# Patient Record
Sex: Male | Born: 1980
Health system: Southern US, Community
[De-identification: ages and names within clinical notes are randomized; demographics above are authoritative.]

## PROBLEM LIST (undated history)

## (undated) HISTORY — PX: OTHER SURGICAL HISTORY: SHX169

## (undated) HISTORY — PX: ARTHROSCOPIC REPAIR ACL: SUR80

## (undated) HISTORY — PX: ACHILLES TENDON REPAIR: SUR1153

---

## 2008-03-10 ENCOUNTER — Emergency Department (HOSPITAL_COMMUNITY): Admission: EM | Admit: 2008-03-10 | Discharge: 2008-03-10 | Payer: Self-pay | Admitting: Emergency Medicine

## 2008-12-01 ENCOUNTER — Emergency Department (HOSPITAL_COMMUNITY): Admission: EM | Admit: 2008-12-01 | Discharge: 2008-12-01 | Payer: Self-pay | Admitting: Emergency Medicine

## 2011-05-06 ENCOUNTER — Ambulatory Visit (INDEPENDENT_AMBULATORY_CARE_PROVIDER_SITE_OTHER): Payer: BC Managed Care – PPO | Admitting: Internal Medicine

## 2011-05-06 ENCOUNTER — Encounter: Payer: Self-pay | Admitting: Internal Medicine

## 2011-05-06 VITALS — BP 102/70 | HR 65 | Temp 97.7°F | Ht 73.0 in | Wt 171.8 lb

## 2011-05-06 DIAGNOSIS — R0602 Shortness of breath: Secondary | ICD-10-CM

## 2011-05-06 DIAGNOSIS — J45901 Unspecified asthma with (acute) exacerbation: Secondary | ICD-10-CM

## 2011-05-06 DIAGNOSIS — J45909 Unspecified asthma, uncomplicated: Secondary | ICD-10-CM

## 2011-05-06 MED ORDER — PREDNISONE 10 MG PO TABS: ORAL_TABLET | ORAL | Status: DC

## 2011-05-06 NOTE — Patient Instructions (Addendum)
Your asthma is active Take prednisone 40mg  po daily x 2 days, then 30mg  po daily x 2 days, then 20mg  po daily x 2 days, then 10mg  po daily x 2 days and STOP Take Symbicort 160/4.5 dose 2 puff bid - take samples, discount card, learn technique. This is a CONTROLLER MED. Take it daily without fail Take inhaled nasal steroid 2 squirts each nostril daily Take albuterol only as needed; if you are using it more than  2 times daily call us or come sooner Return in 4-6 weeks with spirometry at followup At fu check alpha 1 and depending on response,  consider allergy eval

## 2011-05-06 NOTE — Progress Notes (Signed)
Subjective:    Patient ID: Jon Munoz, male    DOB: Apr 25, 1981, 30 y.o.   MRN: [id number]  HPI  30 year old non-smoker, non-passive smoker, no fammily hx of asthma. States he had childhood asthma (Dr Foy [place]) but grew out of it.Used advair in high school. But feels past 3 years asthma has come back but would bother him only once each year esp in pollen season and would use rescue albuterol. However, past 3 weeks feels asthma is acting up. C/o increased dyspnea, dry cough, feeling of clearing throat and wheezing that he rates as moderate and severe. Waking up each night with symptoms at 3am and using albuterol inhaler past 3 weeks. This helps short term only for 4-5 hours. Using it alteast 2 times per night currently. Daytime symptoms are milder esp when outside the house. Symptoms are worse inside the house and in the evening. Daytime is not using albuterol. Does not report fever, chest tightness, sputum. Denies GERD. 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  Moved into current house 3 weeks ago with wife and daughter. 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 Before he moved into that house brother lived in it for 2 years but has been vacant since oct 2011. House is not dusty and states it was cleaned up well before moving in. Carpeted bedroom that is 28 years old and is in good shape (Steam cleaned). Hardwood floor. He does not know if there is mold but he states he would not be surprised if there is mold; has not been at crawl space. There is a musty odor +. No roaches in the house but outside there is. Daughter (7 mo) old and wife aare asymptomatic. 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 Not wearing a good mask. This has been the most he has been around the MDF board ever in career. Denies bird, cats, dogs, exposure.  Office spirometry today - fev1 3Ll/63%, ratio 58 - moderate obstruction   Past Medical History  Diagnosis Date  . Asthma   . Allergic rhinitis      Family History  Problem Relation Age of Onset  . Allergies Sister   . Cancer Maternal Grandfather      History   Social History  . Marital Status: Single    Spouse Name: N/A    Number of Children: N/A  . Years of Education: N/A   Occupational History  . general contractor    Social History Main Topics  . Smoking status: Former Smoker -- 0.1 packs/day for 2 years    Types: Cigarettes    Quit date: 04/15/2006  . Smokeless tobacco: Not on file  . Alcohol Use: Yes     3 drinks per week  . Drug Use: No  . Sexually Active: Not on file   Other Topics Concern  . Not on file   Social History Narrative  . No narrative on file     No Known Allergies   No outpatient prescriptions prior  to visit.      Review of Systems Constitutional:   No  weight loss, night sweats,  Fevers, chills, fatigue, lassitude. HEENT:   No headaches,  Difficulty swallowing,  Tooth/dental problems,  Sore throat,                No sneezing, itching, ear ache, nasal congestion, post nasal drip,   CV:  No chest pain,  Orthopnea, PND, swelling in lower extremities, anasarca, dizziness, palpitations  GI  No heartburn, indigestion, abdominal pain, nausea, vomiting, diarrhea, change in bowel habits, loss of appetite  Resp:PER HPI +   Skin: no rash or lesions.  GU: no dysuria, change in color of urine, no urgency or frequency.  No flank pain.  MS:  No joint pain or swelling.  No decreased range of motion.  No back pain.  Psych:  No change in mood or affect. No depression or anxiety.  No memory loss.     Objective:   Physical Exam    Nursing note and vitals reviewed. Constitutional: He is oriented to person, place, and time. He appears well-developed and well-nourished. No distress.  HENT:  Head: Normocephalic and atraumatic.  Right Ear: External ear normal.  Left Ear: External ear normal.  Mouth/Throat: Oropharynx is clear and moist. No oropharyngeal exudate.  Eyes: Conjunctivae and EOM are normal. Pupils are equal, round, and reactive to light. Right eye exhibits no discharge. Left eye exhibits no discharge. No scleral icterus.  Neck: Normal range of motion. Neck supple. No JVD present. No tracheal deviation present. No thyromegaly present.  Cardiovascular: Normal rate, regular rhythm and intact distal pulses.  Exam reveals no gallop and no friction rub.   No murmur heard. Pulmonary/Chest: Effort normal and breath sounds normal. No respiratory distress. He has no wheezes. He has no rales. He exhibits no tenderness.  Abdominal: Soft. Bowel sounds are normal. He exhibits no distension and no mass. There is no tenderness. There is no rebound and no guarding.  Musculoskeletal: Normal range of motion. He exhibits no edema and no tenderness.  Lymphadenopathy:    He has no cervical adenopathy.  Neurological: He is alert and oriented to person, place, and time. He has normal reflexes. No cranial nerve deficit. Coordination normal.  Skin: Skin is warm and dry. No rash noted. He is not diaphoretic. No erythema. No pallor.  Psychiatric: He has a normal mood and affect. His behavior is normal. Judgment and thought content normal.          Assessment & Plan:

## 2011-05-08 ENCOUNTER — Encounter: Payer: Self-pay | Admitting: Internal Medicine

## 2011-05-08 DIAGNOSIS — J45901 Unspecified asthma with (acute) exacerbation: Secondary | ICD-10-CM | POA: Insufficient documentation

## 2011-05-08 MED ORDER — BUDESONIDE-FORMOTEROL FUMARATE 160-4.5 MCG/ACT IN AERO: 2.0000 | INHALATION_SPRAY | Freq: Two times a day (BID) | RESPIRATORY_TRACT | Status: DC

## 2011-05-08 NOTE — Progress Notes (Signed)
Addended by: Kalman Shan on: 05/08/2011 02:17 AM   Modules accepted: Orders, Level of Service, SmartSet

## 2011-05-08 NOTE — Assessment & Plan Note (Signed)
He is clearly in Ae-asthma. I suspect his asthma has been active for a while but recent work exposure and home environment have resulted in acute exacerbation. Though fev1 is in moderate persisten category he is keen to see quick resolution of symptoms and continued ability to work. Therefore, will give him steroid burst  PLAN Your asthma is active Take prednisone 40mg  po daily x 2 days, then 30mg  po daily x 2 days, then 20mg  po daily x 2 days, then 10mg  po daily x 2 days and STOP Take Symbicort 160/4.5 dose 2 puff bid - take samples, discount card, learn technique. This is a CONTROLLER MED. Take it daily without fail Take inhaled nasal steroid 2 squirts each nostril daily Take albuterol only as needed; if you are using it more than  2 times daily call us or come sooner Return in 4-6 weeks with spirometry at followup Counseled about getting mold eval done at home but he prefers not doing it but moving out of house in 6-8 months Depending on response, consider allergy eval and alpha 1 chck

## 2011-07-29 ENCOUNTER — Ambulatory Visit (INDEPENDENT_AMBULATORY_CARE_PROVIDER_SITE_OTHER): Payer: BC Managed Care – PPO | Admitting: Internal Medicine

## 2011-07-29 ENCOUNTER — Encounter: Payer: Self-pay | Admitting: Internal Medicine

## 2011-07-29 VITALS — BP 90/60 | HR 72 | Temp 97.9°F | Ht 73.0 in | Wt 177.2 lb

## 2011-07-29 DIAGNOSIS — J4540 Moderate persistent asthma, uncomplicated: Secondary | ICD-10-CM

## 2011-07-29 DIAGNOSIS — R6889 Other general symptoms and signs: Secondary | ICD-10-CM

## 2011-07-29 DIAGNOSIS — Z23 Encounter for immunization: Secondary | ICD-10-CM

## 2011-07-29 DIAGNOSIS — J45909 Unspecified asthma, uncomplicated: Secondary | ICD-10-CM

## 2011-07-29 DIAGNOSIS — J45901 Unspecified asthma with (acute) exacerbation: Secondary | ICD-10-CM

## 2011-07-29 MED ORDER — FLUTICASONE PROPIONATE 50 MCG/ACT NA SUSP: 2.0000 | Freq: Every day | NASAL | Status: DC

## 2011-07-29 MED ORDER — OMEPRAZOLE-SODIUM BICARBONATE 20-1100 MG PO CAPS: 1.0000 | ORAL_CAPSULE | Freq: Every day | ORAL | Status: DC

## 2011-07-29 MED ORDER — BUDESONIDE-FORMOTEROL FUMARATE 160-4.5 MCG/ACT IN AERO: 2.0000 | INHALATION_SPRAY | Freq: Two times a day (BID) | RESPIRATORY_TRACT | Status: AC

## 2011-07-29 NOTE — Progress Notes (Signed)
Subjective:    Patient ID: Jon Munoz, male    DOB: January 09, 1981, 30 y.o.   MRN: [id number]  HPI IOV 05/06/11: 30 year old non-smoker (active and passive), no family hx of asthma. States he had childhood asthma (Dr Foy [place]) but grew out of it.Used advair in high school. But feels past 3 years asthma has come back but would bother him only once each year esp in pollen season and would use rescue albuterol. However, past 3 weeks feels asthma is acting up. C/o increased dyspnea, dry cough, feeling of clearing throat and wheezing that he rates as moderate and severe. Waking up each night with symptoms at 3am and using albuterol inhaler past 3 weeks. This helps short term only for 4-5 hours. Using it alteast 2 times per night currently. Daytime symptoms are milder esp when outside the house. Symptoms are worse inside the house and in the evening. Daytime is not using albuterol. Does not report fever, chest tightness, sputum. Denies GERD. 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  Moved into current house 3 weeks ago with wife and daughter. 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 Before he moved into that house brother lived in it for 2 years but has been vacant since oct 2011. House is not dusty and states it was cleaned up well before moving in. Carpeted bedroom that is 28 years old and is in good shape (Steam cleaned). Hardwood floor. He does not know if there is mold but he states he would not be surprised if there is mold; has not been at crawl space. There is a musty odor +. No roaches in the house but outside there is. Daughter (7 mo) old and wife are asymptomatic. 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 Not wearing a good mask. This has been the most he has been around the MDF board ever in career. Denies bird, cats, dogs, exposure.  Office spirometry today - fev1 3Ll/63%, ratio 58 - moderate obstruction  REC You have asthma Pred burst Symbicort Nasal steroid Alb prn Return in 4-6 weeks with spirometry at followup At fu check alpha 1 and depending on response,  consider allergy eval   OV 07/28/11: Followup asthma. States symbicort helped a lot. Last week realized he was running out of symbicort so cut down to once a day. Other than that was taking symbicort with 100% compliance. Feels fine in terms of resp issues. Not needing albuterol at all in 3  months. No dyspnea. No wheeze.No cough but feels he is still having to clear throat. This is the only symptom unchanged v mildly better. No nocturnal symptoms. Still in same old house but is having new house built and will move there in 6 months. Fev1 improved 500cc to 3.5L/73% with ratio65 (there is still evidence of obstruction)  His main current concern is the clearing of throat. He says it does not bother him but states wife is bothered by it . Annoying to wife. States he does it atleast  50 times daily. He demonstrated clearing of throat. He feels that something is tickling throat or sometimes upper chest he feels a 'build up' of perhaps 'phlegm'. This happens at night > day but states he does it all the time. Albuterol does not help at all. He feels this symptom is independent of asthma. Dad and brother have the same. This visit admits to sinus drainage. Denies gerd.   His other concern is the long term health and need for ICS/LABA and wondering if asthma and copd same.    Review of Systems  Constitutional: Negative for fever and unexpected weight change.  HENT: Negative for ear pain, nosebleeds, congestion, sore throat, rhinorrhea, sneezing, trouble  swallowing, dental problem, postnasal drip and sinus pressure.   Eyes: Negative for redness and itching.  Respiratory: Negative for cough, chest tightness, shortness of breath and wheezing.   Cardiovascular: Negative for palpitations and leg swelling.  Gastrointestinal: Negative for nausea and vomiting.  Genitourinary: Negative for dysuria.  Musculoskeletal: Negative for joint swelling.  Skin: Negative for rash.  Neurological: Negative for headaches.  Hematological: Does not bruise/bleed easily.  Psychiatric/Behavioral: Negative for dysphoric mood. The patient is not nervous/anxious.        Objective:   Physical Exam Nursing note and vitals reviewed. Constitutional: He is oriented to person, place, and time. He appears well-developed and well-nourished. No distress.  HENT:  Head: Normocephalic and atraumatic.  Right Ear: External ear normal.  Left Ear: External ear normal.  Mouth/Throat: Oropharynx is clear and moist. No oropharyngeal exudate.  Eyes: Conjunctivae and EOM are normal. Pupils are equal, round, and reactive to light. Right eye exhibits no discharge. Left eye exhibits no discharge. No scleral icterus.  Neck: Normal range of motion. Neck supple. No JVD present. No tracheal deviation present. No thyromegaly present.  Cardiovascular: Normal rate, regular rhythm and intact distal pulses.  Exam reveals no gallop and no friction rub.   No murmur heard. Pulmonary/Chest: Effort normal and breath sounds normal. No respiratory distress. He has no wheezes. He has no rales. He exhibits no tenderness.  Abdominal: Soft. Bowel sounds are normal. He exhibits no distension and no mass. There is no tenderness. There is no rebound and no guarding.  Musculoskeletal: Normal range of motion. He exhibits no edema and no tenderness.  Lymphadenopathy:    He has no cervical adenopathy.  Neurological: He is alert and oriented to person, place, and time. He has normal reflexes. No cranial nerve  deficit. Coordination normal.  Skin: Skin is warm and dry. No rash noted. He is not diaphoretic. No erythema. No pallor.  Psychiatric: He has a normal mood and affect. His behavior is normal. Judgment and thought content normal.        Assessment & Plan:

## 2011-07-29 NOTE — Patient Instructions (Addendum)
#  asthma - much better; lung function improved by half liter to 73%. We need you to get to 80% - so please continue symbicort 160/4.5 2 puff twice daily - rinse mouth after use  - take albuterol as needed and before your workouts as well - taking it before workouts is important esp if you find it helps you  - have flu shot - we dicussed copd v asthma - you have asthma. It is important you take inhaler like symbicort for a long time without fail - work on wearing mask at work and cutting down on exposure to triggers #Clearing of throat  - this could be due to sinus, silent acid reflux or both or even habit  - for acid reflux - take otc zegerid 20mg  daily on empty stomach - take diet sheet for acid reflux  - take nasonex samples x 4 - use 2 squirts each nostril daily #Followup - 2 months to see progress  - if no improvement in month or so call us

## 2011-07-30 DIAGNOSIS — R6889 Other general symptoms and signs: Secondary | ICD-10-CM | POA: Insufficient documentation

## 2011-07-30 DIAGNOSIS — J4540 Moderate persistent asthma, uncomplicated: Secondary | ICD-10-CM | POA: Insufficient documentation

## 2011-07-30 NOTE — Assessment & Plan Note (Signed)
#  asthma - much better; lung function. Improved by half liter to 73%. But still room to improve. We need you to get to 80% - so please continue symbicort 160/4.5 2 puff twice daily - rinse mouth after use  - take albuterol as needed and before your workouts as well - taking it before workouts is important esp if you find it helps you  - have flu shot - we dicussed copd v asthma - you have asthma. It is important you take inhaler like symbicort for a long time without fail - work on wearing mask at work and cutting down on exposure to triggers   40 minute total visit time. > 50% of time spent in face to face counseling. 12 min on this section

## 2011-07-30 NOTE — Assessment & Plan Note (Signed)
#  Clearing of throat  - this could be due to sinus, silent acid reflux or both or even habit  - for acid reflux - take otc zegerid 20mg  daily on empty stomach - take diet sheet for acid reflux  - take nasonex samples x 4 - use 2 squirts each nostril daily #Followup - 2 months to see progress  - if no improvement in month or so call us   total visit time. > 50% in face to face counseling. 10 min in this section

## 2012-05-16 ENCOUNTER — Encounter (HOSPITAL_COMMUNITY): Payer: Self-pay | Admitting: *Deleted

## 2012-05-16 ENCOUNTER — Emergency Department (HOSPITAL_COMMUNITY)
Admission: EM | Admit: 2012-05-16 | Discharge: 2012-05-16 | Disposition: A | Discharge status: Home / Self Care | Payer: BC Managed Care – PPO | Attending: Emergency Medicine | Admitting: Emergency Medicine

## 2012-05-16 ENCOUNTER — Emergency Department (HOSPITAL_COMMUNITY): Payer: BC Managed Care – PPO

## 2012-05-16 DIAGNOSIS — J45909 Unspecified asthma, uncomplicated: Secondary | ICD-10-CM | POA: Insufficient documentation

## 2012-05-16 DIAGNOSIS — S81809A Unspecified open wound, unspecified lower leg, initial encounter: Secondary | ICD-10-CM | POA: Insufficient documentation

## 2012-05-16 DIAGNOSIS — S81819A Laceration without foreign body, unspecified lower leg, initial encounter: Secondary | ICD-10-CM

## 2012-05-16 DIAGNOSIS — W1789XA Other fall from one level to another, initial encounter: Secondary | ICD-10-CM | POA: Insufficient documentation

## 2012-05-16 NOTE — ED Provider Notes (Signed)
History     CSN: [id number]  Arrival date & time 05/16/12  1550   First MD Initiated Contact with Patient 05/16/12 [phone]      Chief Complaint  Patient presents with  . Laceration    (Consider location/radiation/quality/duration/timing/severity/associated sxs/prior treatment) Patient is a 31 y.o. male presenting with skin laceration. The history is provided by the patient.  Laceration    patient states that he fell about 6 feet off a walking board. He states a n fell on him and he hit his right lower leg. He has a laceration and pain. No other pains. He states his tetanus is up-to-date. No fevers. No headache. No chest or abdominal pain. He states he's been walking on it with some pain. He  Past Medical History  Diagnosis Date  . Asthma   . Allergic rhinitis     Past Surgical History  Procedure Date  . Arthroscopic repair acl     right knee  . Inguinal hernia repair     right side  . Achilles tendon repair     right foot    Family History  Problem Relation Age of Onset  . Allergies Sister   . Cancer Maternal Grandfather     History  Substance Use Topics  . Smoking status: Former Smoker -- 0.1 packs/day for 2 years    Types: Cigarettes    Quit date: 04/15/2006  . Smokeless tobacco: Not on file  . Alcohol Use: Yes     3 drinks per week      Review of Systems  Constitutional: Negative for chills.  Respiratory: Negative for cough.   Cardiovascular: Negative for chest pain.  Musculoskeletal: Negative for back pain.  Skin: Positive for wound.  Neurological: Negative for tremors, syncope and headaches.    Allergies  Review of patient's allergies indicates no known allergies.  Home Medications   Current Outpatient Rx  Name Route Sig Dispense Refill  . BUDESONIDE-FORMOTEROL FUMARATE 160-4.5 MCG/ACT IN AERO Inhalation Inhale 2 puffs into the lungs 2 (two) times daily. 1 Inhaler 3    BP 120/70  Pulse 63  Temp 97.8 F (36.6 C)  Resp 16  SpO2  100%  Physical Exam  Nursing note and vitals reviewed. Constitutional: He is oriented to person, place, and time. He appears well-developed and well-nourished.  HENT:  Head: Normocephalic and atraumatic.  Eyes: EOM are normal. Pupils are equal, round, and reactive to light.  Neck: Normal range of motion. Neck supple.  Cardiovascular: Normal rate, regular rhythm and normal heart sounds.   No murmur heard. Pulmonary/Chest: Effort normal and breath sounds normal.  Abdominal: Soft. Bowel sounds are normal. He exhibits no distension and no mass. There is no tenderness. There is no rebound and no guarding.  Musculoskeletal: Normal range of motion. He exhibits no edema.       8 centimeter laceration to mid-to lower tibia. There is a swelling proximal to that in the soft tissue. With palpation and spread out. Mild bleeding. Tibia appears stable. Neurovascularly intact distally. He was able to flex and extend his foot at the ankle.   Neurological: He is alert and oriented to person, place, and time. No cranial nerve deficit.  Skin: Skin is warm and dry.  Psychiatric: He has a normal mood and affect.    ED Course  Procedures (including critical care time)  Labs Reviewed - No data to display No results found.   No diagnosis found.  LACERATION REPAIR Performed by: Billee Cashing Authorized by:  Aleksa Catterton R. Consent: Verbal consent obtained. Risks and benefits: risks, benefits and alternatives were discussed Consent given by: patient Patient identity confirmed: provided demographic data Prepped and Draped in normal sterile fashion Wound explored  Laceration Location: right lower leg  Laceration Length: 8cm  No Foreign Bodies seen or palpated  Anesthesia: local infiltration  Local anesthetic: lidocaine 2% with epinephrine  Anesthetic total: 10 ml  Irrigation method: syringe Amount of cleaning: standard  Skin closure: 3-0 proline  Number of sutures: 8  Technique:  simple interupted  Patient tolerance: Patient tolerated the procedure well with no immediate complications.  MDM  Patient was laceration to lower leg after fall. Tetanus is up-to-date. Wound appears to go through the fascia. Some fat was removed. There is a loose piece proximal to the wound, however cannot be reached for suturing. No foreign body seen. Wound was closed. Some of the wound was through somewhat to superficial.        Juliet Rude. Rubin Payor, MD 05/16/12 [phone]

## 2012-05-16 NOTE — ED Notes (Signed)
The pt was at work and he fell 6 feet off a walk  Board and  The ladder he was on fell with him and trapped his  Rt lower leg.  Lac swelling and pain

## 2013-02-06 IMAGING — CR DG TIBIA/FIBULA 2V*R*
4 series · 4 of 4 positions shown · non-contrast
Comparison: None.

CLINICAL DATA: Pain, laceration, fall.

RIGHT TIBIA AND FIBULA - 2 VIEW

[x tib-fib ap right]
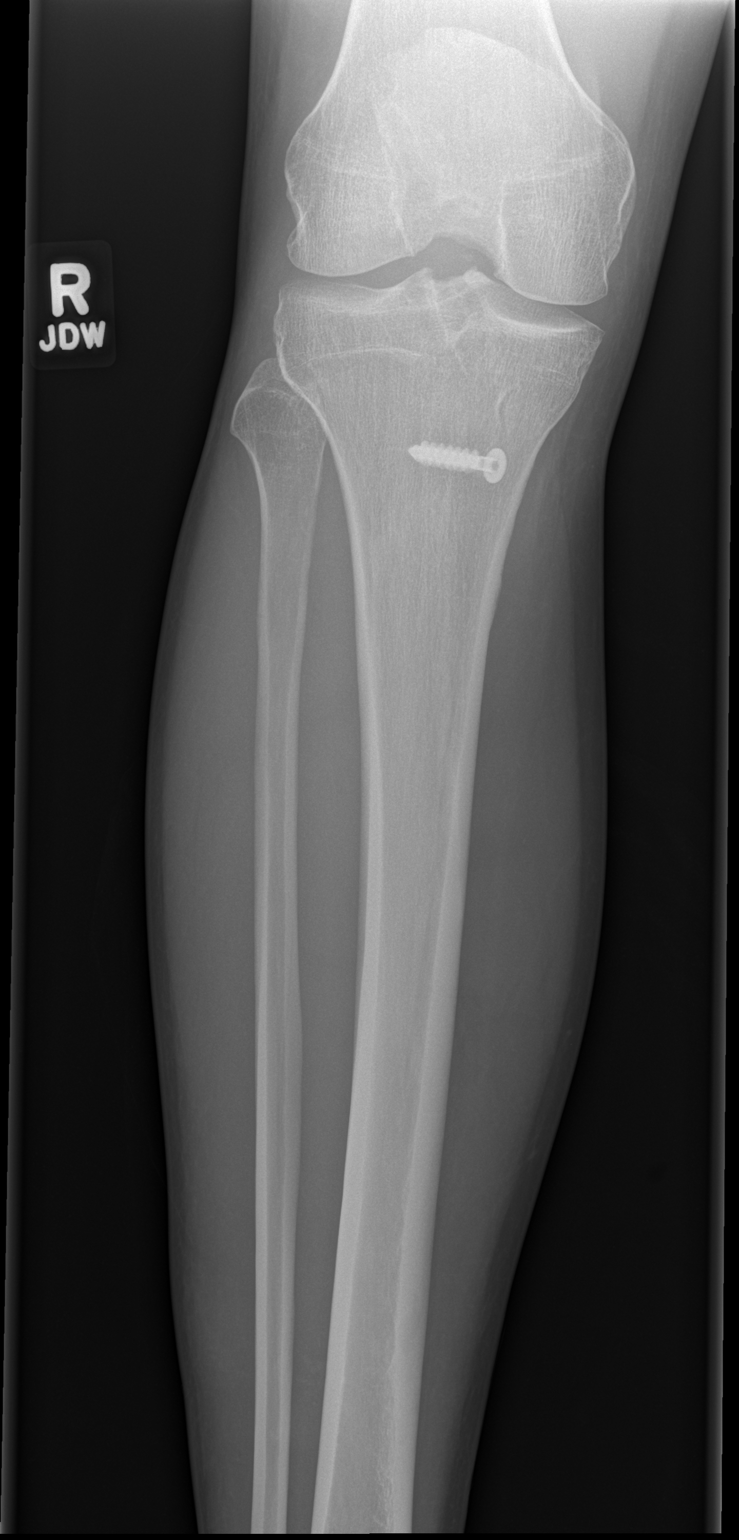

[x tib-fib lat right (1 of 3)]
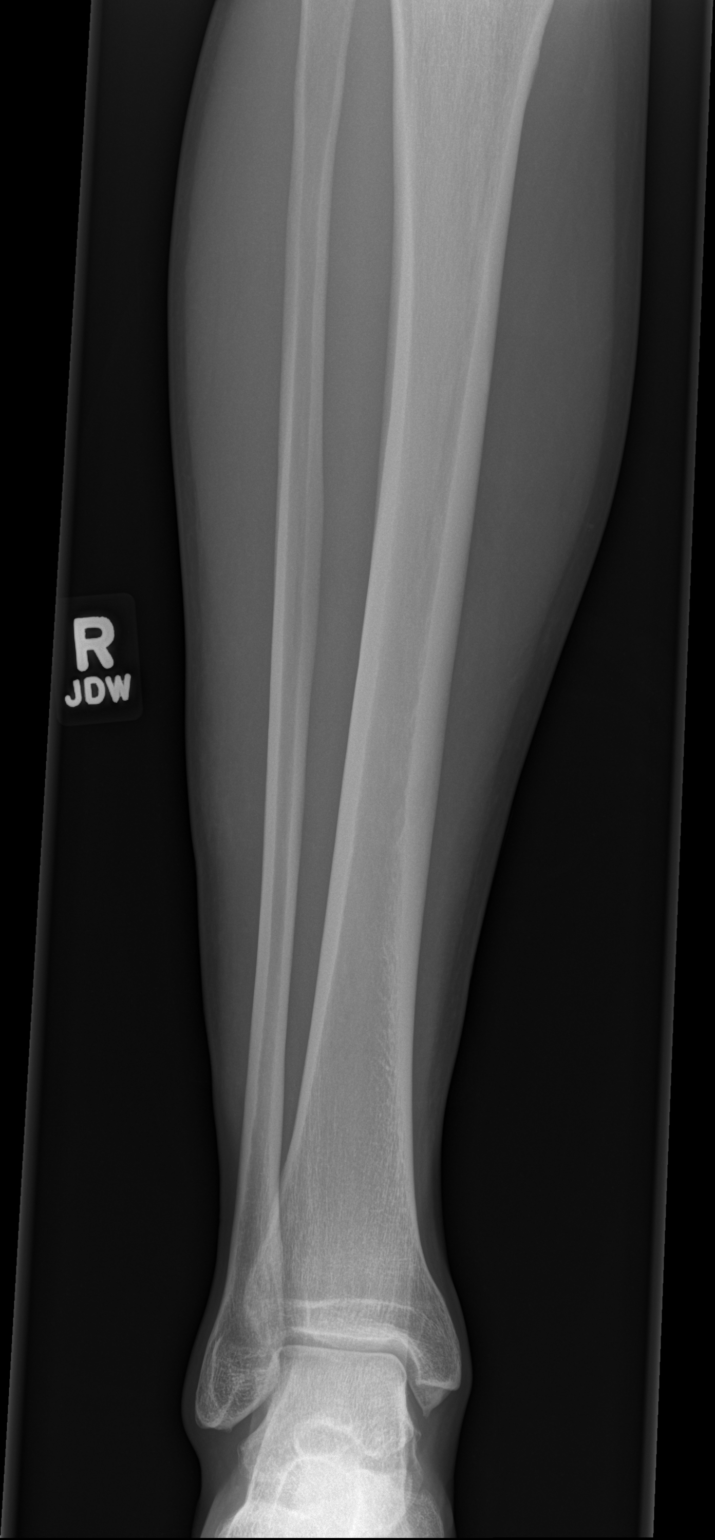

[x tib-fib lat right (2 of 3)]
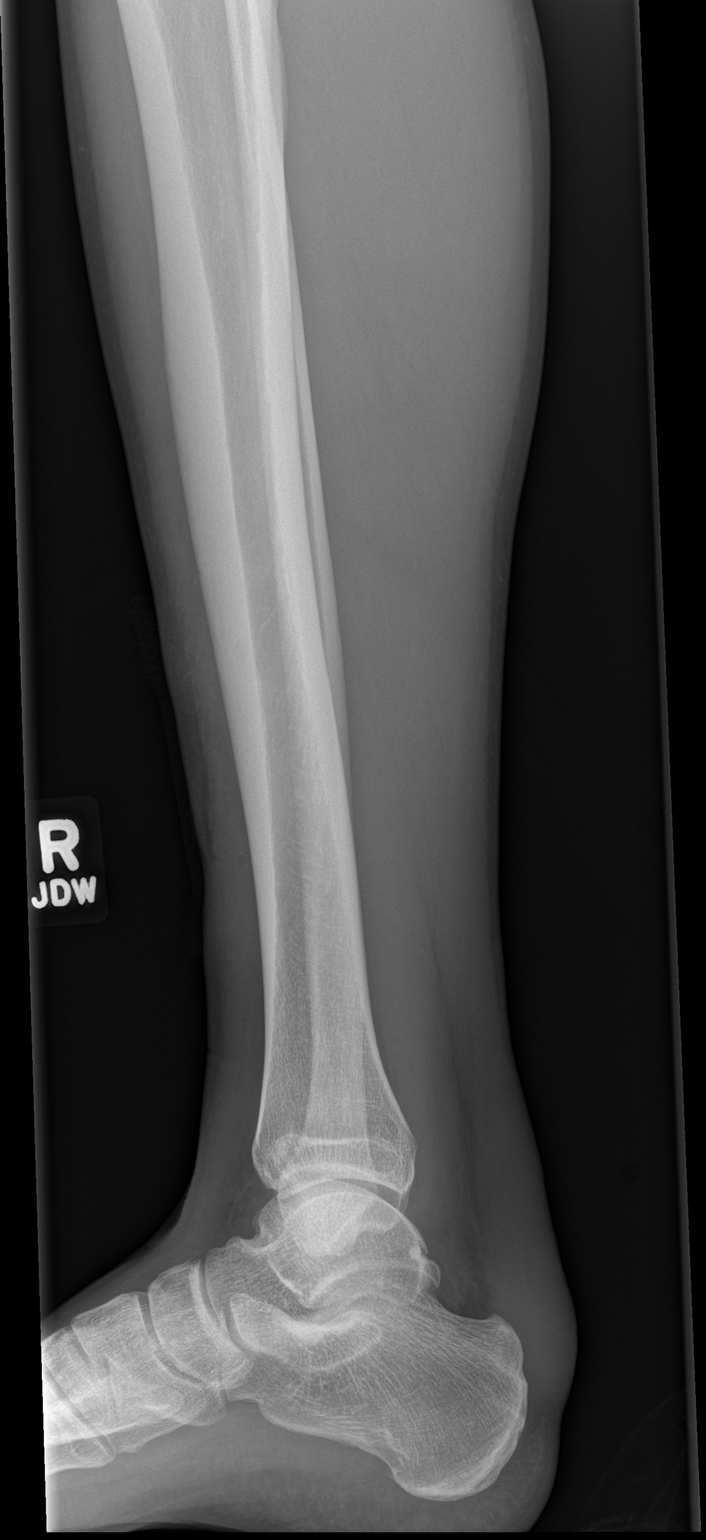

[x tib-fib lat right (3 of 3)]
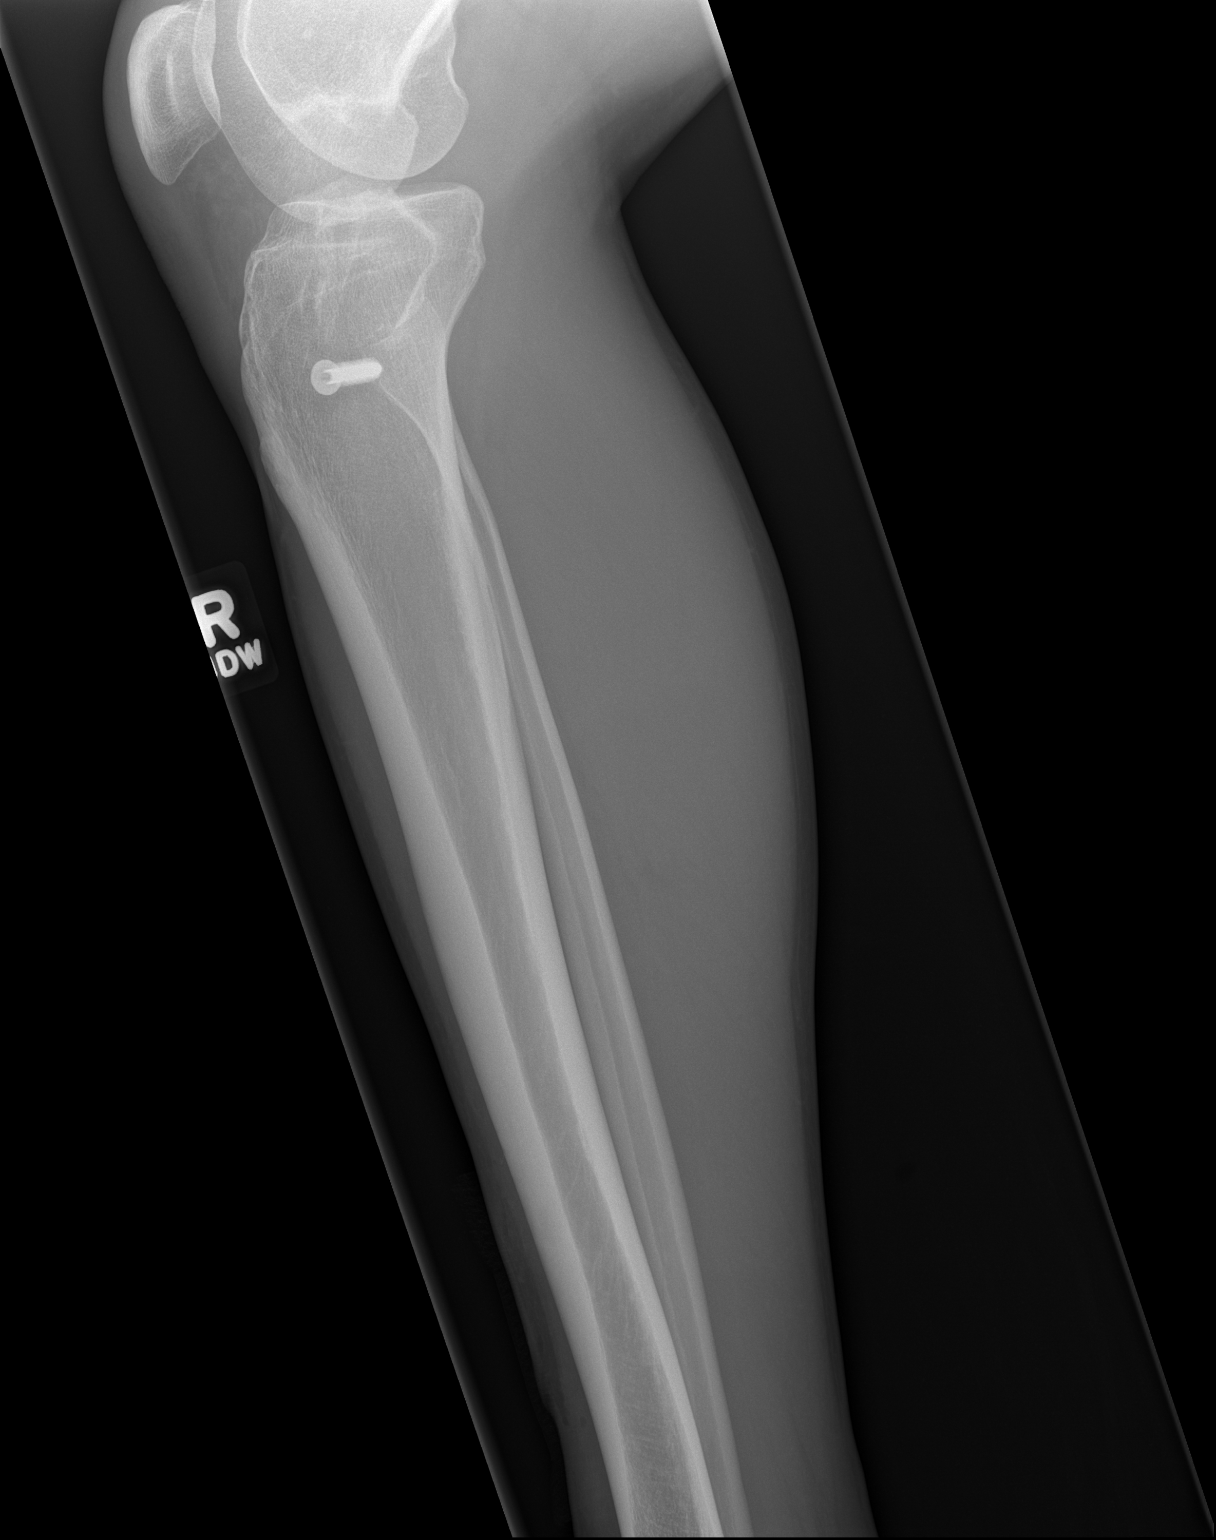

[4 of 4 positions shown; findings below may reference images not displayed]

FINDINGS: There is a screw within the proximal tibia. No acute bony
abnormality.  Specifically, no fracture, subluxation, or
dislocation.  Soft tissues are intact.  No radiopaque foreign
bodies.
IMPRESSION: No acute bony abnormality.

## 2019-09-14 ENCOUNTER — Ambulatory Visit (INDEPENDENT_AMBULATORY_CARE_PROVIDER_SITE_OTHER): Payer: 59 | Admitting: Sports Medicine

## 2019-09-14 ENCOUNTER — Encounter: Payer: Self-pay | Admitting: Sports Medicine

## 2019-09-14 ENCOUNTER — Other Ambulatory Visit: Payer: Self-pay

## 2019-09-14 DIAGNOSIS — M654 Radial styloid tenosynovitis [de Quervain]: Secondary | ICD-10-CM

## 2019-09-14 MED ORDER — MELOXICAM 15 MG PO TABS: ORAL_TABLET | ORAL | 3 refills | Status: AC

## 2019-09-14 NOTE — Assessment & Plan Note (Signed)
Thumb spica, meloxicam. Rehab exercises given, return to see me in 3 weeks, or sooner if needed for injection.

## 2019-09-14 NOTE — Progress Notes (Signed)
Subjective:    CC: Right wrist pain  HPI:  For 2 months this pleasant 38 year old male has had pain in his right wrist, radial aspect over the radial styloid, worse with ulnar deviation of the wrist, gripping motions, repetitive motion.  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  Symptoms are severe, persistent.  Localized over the radial styloid with radiation up the forearm.  I reviewed the past medical history, family history, social history, surgical history, and allergies today and no changes were needed.  Please see the problem list section below in epic for further details.  Past Medical History: Past Medical History:  Diagnosis Date  . Allergic rhinitis   . Asthma    Past Surgical History: Past Surgical History:  Procedure Laterality Date  . ACHILLES TENDON REPAIR     right foot  . ARTHROSCOPIC REPAIR ACL     right knee  . inguinal hernia repair     right side   Social History: Social History   Socioeconomic History  . Marital status: Single    Spouse name: Not on file  . Number of children: Not on file  . Years of education: Not on file  . Highest education level: Not on file  Occupational History  . Occupation: Furniture conservator/restorer  . Financial resource strain: Not on file  . Food insecurity    Worry: Not on file    Inability: Not on file  . Transportation needs    Medical: Not on file    Non-medical: Not on file  Tobacco Use  . Smoking status: Former Smoker    Packs/day: 0.10    Years: 2.00    Pack years: 0.20    Types: Cigarettes    Quit date: 04/15/2006    Years since quitting: 13.4  . Smokeless tobacco: Never Used  Substance and Sexual Activity  . Alcohol use: Yes    Comment: 3 drinks per week  . Drug use: No  . Sexual activity: Not on file  Lifestyle  . Physical activity    Days per week: Not on file    Minutes per session: Not on file  . Stress: Not on file   Relationships  . Social Herbalist on phone: Not on file    Gets together: Not on file    Attends religious service: Not on file    Active member of club or organization: Not on file    Attends meetings of clubs or organizations: Not on file    Relationship status: Not on file  Other Topics Concern  . Not on file  Social History Narrative  . Not on file   Family History: Family History  Problem Relation Age of Onset  . Allergies Sister   . Cancer Maternal Grandfather    Allergies: No Known Allergies Medications: See med rec.  Review of Systems: No headache, visual changes, nausea, vomiting, diarrhea, constipation, dizziness, abdominal pain, skin rash, fevers, chills, night sweats, swollen lymph nodes, weight loss, chest pain, body aches, joint swelling, muscle aches, shortness of breath, mood changes, visual or auditory hallucinations.  Objective:    General: Well Developed, well nourished, and in no acute distress.  Neuro: Alert and oriented x3, extra-ocular muscles intact, sensation grossly intact.  HEENT: Normocephalic, atraumatic, pupils equal round reactive to light, neck supple, no masses, no lymphadenopathy, thyroid nonpalpable.  Skin: Warm and dry, no rashes noted.  Cardiac: Regular rate  and rhythm, no murmurs rubs or gallops.  Respiratory: Clear to auscultation bilaterally. Not using accessory muscles, speaking in full sentences.  Abdominal: Soft, nontender, nondistended, positive bowel sounds, no masses, no organomegaly.  Right wrist: Inspection normal with no visible erythema or swelling. ROM smooth and normal with good flexion and extension and ulnar/radial deviation that is symmetrical with opposite wrist. Palpation is normal over metacarpals, navicular, lunate, and TFCC; tendons without tenderness/ swelling No snuffbox tenderness. No tenderness over Canal of Guyon. Strength 5/5 in all directions without pain. Negative tinel's and phalens signs.  Positive Finkelstein sign. Negative Watson's test.  Impression and Recommendations:    The patient was counselled, risk factors were discussed, anticipatory guidance given.  De Quervain's tenosynovitis, right Thumb spica, meloxicam. Rehab exercises given, return to see me in 3 weeks, or sooner if needed for injection.   ___________________________________________ Jon Munoz. Benjamin Stain, M.D., ABFM., CAQSM. Primary Care and Sports Medicine [facility]  Adjunct Professor of Family Medicine  [employer]

## 2019-10-05 ENCOUNTER — Ambulatory Visit: Payer: 59 | Admitting: Sports Medicine

## 2023-08-22 DIAGNOSIS — L255 Unspecified contact dermatitis due to plants, except food: Secondary | ICD-10-CM | POA: Diagnosis not present
